# Patient Record
Sex: Female | Born: 1975 | Race: White | Hispanic: No | Marital: Single | Smoking: Never smoker
Health system: Southern US, Community
[De-identification: ages and names within clinical notes are randomized; demographics above are authoritative.]

---

## 2018-09-14 DIAGNOSIS — M79641 Pain in right hand: Secondary | ICD-10-CM | POA: Diagnosis not present

## 2018-09-14 DIAGNOSIS — S63636A Sprain of interphalangeal joint of right little finger, initial encounter: Secondary | ICD-10-CM | POA: Diagnosis not present

## 2019-04-11 ENCOUNTER — Ambulatory Visit: Payer: Self-pay | Attending: Internal Medicine

## 2019-04-11 DIAGNOSIS — Z20822 Contact with and (suspected) exposure to covid-19: Secondary | ICD-10-CM

## 2019-04-12 LAB — NOVEL CORONAVIRUS, NAA: SARS-CoV-2, NAA: NOT DETECTED

## 2019-04-19 DIAGNOSIS — M25561 Pain in right knee: Secondary | ICD-10-CM | POA: Diagnosis not present

## 2019-04-25 DIAGNOSIS — M25561 Pain in right knee: Secondary | ICD-10-CM | POA: Diagnosis not present

## 2019-04-25 DIAGNOSIS — M6281 Muscle weakness (generalized): Secondary | ICD-10-CM | POA: Diagnosis not present

## 2019-04-25 DIAGNOSIS — M222X1 Patellofemoral disorders, right knee: Secondary | ICD-10-CM | POA: Diagnosis not present

## 2019-04-29 DIAGNOSIS — M222X1 Patellofemoral disorders, right knee: Secondary | ICD-10-CM | POA: Diagnosis not present

## 2019-04-29 DIAGNOSIS — M6281 Muscle weakness (generalized): Secondary | ICD-10-CM | POA: Diagnosis not present

## 2019-04-29 DIAGNOSIS — M25561 Pain in right knee: Secondary | ICD-10-CM | POA: Diagnosis not present

## 2019-05-09 DIAGNOSIS — M6281 Muscle weakness (generalized): Secondary | ICD-10-CM | POA: Diagnosis not present

## 2019-05-09 DIAGNOSIS — M222X1 Patellofemoral disorders, right knee: Secondary | ICD-10-CM | POA: Diagnosis not present

## 2019-05-09 DIAGNOSIS — M25561 Pain in right knee: Secondary | ICD-10-CM | POA: Diagnosis not present

## 2019-06-02 ENCOUNTER — Ambulatory Visit: Payer: Self-pay

## 2019-07-06 DIAGNOSIS — M25561 Pain in right knee: Secondary | ICD-10-CM | POA: Diagnosis not present

## 2019-07-06 DIAGNOSIS — M6281 Muscle weakness (generalized): Secondary | ICD-10-CM | POA: Diagnosis not present

## 2019-07-06 DIAGNOSIS — M222X1 Patellofemoral disorders, right knee: Secondary | ICD-10-CM | POA: Diagnosis not present

## 2019-07-14 ENCOUNTER — Ambulatory Visit: Payer: Self-pay | Attending: Internal Medicine

## 2019-07-14 DIAGNOSIS — Z23 Encounter for immunization: Secondary | ICD-10-CM

## 2019-07-14 NOTE — Progress Notes (Signed)
   Covid-19 Vaccination Clinic  Name:  Julie Nelson    MRN: [id number] DOB: [birth year]  07/14/2019  [name] was observed post Covid-19 immunization for 15 minutes without incident. She was provided with Vaccine Information Sheet and instruction to access the V-Safe system.   [name] was instructed to call 911 with any severe reactions post vaccine: Marland Kitchen Difficulty breathing  . Swelling of face and throat  . A fast heartbeat  . A bad rash all over body  . Dizziness and weakness   Immunizations Administered    Name Date Dose VIS Date Route   Pfizer COVID-19 Vaccine 07/14/2019  4:10 PM 0.3 mL 04/13/2018 Intramuscular   Manufacturer: [employer], [employer]   Lot: [id number]   NDC: [address]-1

## 2019-08-06 ENCOUNTER — Ambulatory Visit: Payer: Self-pay | Attending: Internal Medicine

## 2019-08-06 DIAGNOSIS — Z23 Encounter for immunization: Secondary | ICD-10-CM

## 2019-08-06 NOTE — Progress Notes (Signed)
   Covid-19 Vaccination Clinic  Name:  Julie Nelson    MRN: [id number] DOB: 1975-10-09  08/06/2019  [name] was observed post Covid-19 immunization for 15 minutes without incident. She was provided with Vaccine Information Sheet and instruction to access the V-Safe system.   [name] was instructed to call 911 with any severe reactions post vaccine: Marland Kitchen Difficulty breathing  . Swelling of face and throat  . A fast heartbeat  . A bad rash all over body  . Dizziness and weakness   Immunizations Administered    Name Date Dose VIS Date Route   Pfizer COVID-19 Vaccine 08/06/2019 11:53 AM 0.3 mL 04/13/2018 Intramuscular   Manufacturer: [employer], [employer]   Lot: [id number]   NDC: [address]-6

## 2019-08-08 ENCOUNTER — Ambulatory Visit: Payer: Self-pay

## 2020-01-06 ENCOUNTER — Ambulatory Visit
Admission: EM | Admit: 2020-01-06 | Discharge: 2020-01-06 | Disposition: A | Discharge status: Home / Self Care | Payer: BLUE CROSS/BLUE SHIELD | Attending: Family Medicine | Admitting: Family Medicine

## 2020-01-06 DIAGNOSIS — M549 Dorsalgia, unspecified: Secondary | ICD-10-CM

## 2020-01-06 DIAGNOSIS — R109 Unspecified abdominal pain: Secondary | ICD-10-CM

## 2020-01-06 LAB — POCT URINALYSIS DIP (MANUAL ENTRY)
Bilirubin, UA: NEGATIVE
Glucose, UA: NEGATIVE mg/dL
Ketones, POC UA: NEGATIVE mg/dL
Leukocytes, UA: NEGATIVE
Nitrite, UA: NEGATIVE
Protein Ur, POC: NEGATIVE mg/dL
Spec Grav, UA: 1.01 (ref 1.010–1.025)
Urobilinogen, UA: 0.2 E.U./dL
pH, UA: 6 (ref 5.0–8.0)

## 2020-01-06 LAB — POCT URINE PREGNANCY: Preg Test, Ur: NEGATIVE

## 2020-01-06 NOTE — ED Provider Notes (Signed)
[facility]    CSN: [id number] Arrival date & time: 01/06/20  1259      History   Chief Complaint Chief Complaint  Patient presents with  . Chest Pain    HPI Julie Nelson is a 44 y.o. female.   HPI  Patient presents today with pain involving right flank below rib cage and right sided back pain. She also endorses 4 weeks of feeling lightheaded intermittently.  She has not seen a healthcare provider in over 3 years and has a complete physical exam scheduled for 01/30/2020.  She denies any specific chest pain symptoms.  She denies any known history of hyperlipidemia or hypertension.  She reports that these episodes of right flank pain have been occurring over the last 7 days.  They last for approximately 6 seconds and are sharp and feels more muscular in nature.  The symptoms are nonreproducible.  She grew concerned as she had approximately 6 episodes in the last 24 hours and today she experienced some back pain.  Reports over the last week she had had dairy products while visiting at the beach and she normally does not intake of dairy products as this causes her to have back pain.  In the past when she stopped consuming dairy type products the back pain has gone away.  She denies any gas but also endorses some bloating sensations in her abdomen.  No fever and no other URI symptoms.  History reviewed. No pertinent past medical history.  There are no problems to display for this patient.   History reviewed. No pertinent surgical history.  OB History   No obstetric history on file.      Home Medications    Prior to Admission medications   Not on File    Family History History reviewed. No pertinent family history.  Social History Social History   Tobacco Use  . Smoking status: Never Smoker  . Smokeless tobacco: Never Used  Substance Use Topics  . Alcohol use: Yes  . Drug use: Never     Allergies   Patient has no known allergies.  Review of  Systems Review of Systems Pertinent negatives listed in HPI   Physical Exam Triage Vital Signs ED Triage Vitals [01/06/20 1318]  Enc Vitals Group     BP (!) 152/93     Pulse Rate 65     Resp 18     Temp 98 F (36.7 C)     Temp Source Oral     SpO2 98 %     Weight      Height      Head Circumference      Peak Flow      Pain Score 0     Pain Loc      Pain Edu?      Excl. in GC?    No data found.  Updated Vital Signs BP (!) 152/93 (BP Location: Left Arm)   Pulse 65   Temp 98 F (36.7 C) (Oral)   Resp 18   LMP 12/23/2019   SpO2 98%   Visual Acuity Right Eye Distance:   Left Eye Distance:   Bilateral Distance:    Right Eye Near:   Left Eye Near:    Bilateral Near:     Physical Exam Constitutional:      Appearance: She is not ill-appearing.  HENT:     Head: Normocephalic and atraumatic.  Cardiovascular:     Rate and Rhythm: Normal rate and regular  rhythm.  Pulmonary:     Effort: Pulmonary effort is normal.     Breath sounds: Normal breath sounds.  Abdominal:     General: Bowel sounds are normal.     Palpations: Abdomen is soft. There is no splenomegaly.     Tenderness: There is no abdominal tenderness. There is no guarding or rebound.  Musculoskeletal:     Cervical back: Normal range of motion.  Skin:    General: Skin is warm and dry.     Capillary Refill: Capillary refill takes less than 2 seconds.  Neurological:     General: No focal deficit present.     Mental Status: She is alert and oriented to person, place, and time.  Psychiatric:        Mood and Affect: Mood normal.      UC Treatments / Results  Labs (all labs ordered are listed, but only abnormal results are displayed) Labs Reviewed  POCT URINALYSIS DIP (MANUAL ENTRY)  POCT URINE PREGNANCY    EKG   Radiology No results found.  Procedures Procedures (including critical care time)  Medications Ordered in UC Medications - No data to display  Initial Impression / Assessment  and Plan / UC Course  I have reviewed the triage vital signs and the nursing notes.  Pertinent labs & imaging results that were available during my care of the patient were reviewed by me and considered in my medical decision making (see chart for details).    UA negative.  Exam is negative for any peritoneal signs or any other concerning findings that would pertain to an acute abdomen.  Pain is not reproducible on exam.  EKG normal sinus rhythm, rate 64 bpm no ST changes.  Advised patient basic exam here findings are nonworrisome however patient should continue with physical exam scheduled for December with primary care provider.  Strict ER precautions given.  Also advised to trial Tylenol in order Tums to determine if symptoms she is experiencing is related to a GI etiology and or a musculoskeletal nature.  Patient patient verbalized understanding and agreement with plan will go to the ER if symptoms worsen or continue to occur. Final Clinical Impressions(s) / UC Diagnoses   Final diagnoses:  Right flank pain  Acute right-sided back pain, unspecified back location     Discharge Instructions     Your urinalysis was clear today.  If pain recurs recommend trying Tylenol 500 mg if this resolves the symptoms then pain is likely related to musculoskeletal.  If Tylenol is ineffective recommend Tums which would relieve any acid or indigestion type symptoms.  If any of your symptoms become severe or worsen I would recommend immediate evaluation in the setting of the emergency department.  Otherwise can keep follow-up for complete physical exam with your primary care in a few weeks.    ED Prescriptions    None     PDMP not reviewed this encounter.   Bing Neighbors, FNP 01/06/20 1419

## 2020-01-06 NOTE — Discharge Instructions (Addendum)
Your urinalysis was clear today.  If pain recurs recommend trying Tylenol 500 mg if this resolves the symptoms then pain is likely related to musculoskeletal.  If Tylenol is ineffective recommend Tums which would relieve any acid or indigestion type symptoms.  If any of your symptoms become severe or worsen I would recommend immediate evaluation in the setting of the emergency department.  Otherwise can keep follow-up for complete physical exam with your primary care in a few weeks.

## 2020-01-06 NOTE — ED Triage Notes (Signed)
Pt c/o sharp pain under lt breast x4 day. States had 6 episodes today lasting 2 secs each. States had a pain shoot through to back x1. Pt c/o having dizziness and SOB at different times then the sharp pain. No distress noted.

## 2020-01-30 DIAGNOSIS — Z23 Encounter for immunization: Secondary | ICD-10-CM | POA: Diagnosis not present

## 2020-01-30 DIAGNOSIS — Z0000 Encounter for general adult medical examination without abnormal findings: Secondary | ICD-10-CM | POA: Diagnosis not present

## 2020-01-30 DIAGNOSIS — R739 Hyperglycemia, unspecified: Secondary | ICD-10-CM | POA: Diagnosis not present

## 2020-02-02 ENCOUNTER — Other Ambulatory Visit: Payer: Self-pay | Admitting: Internal Medicine

## 2020-02-02 DIAGNOSIS — Z1231 Encounter for screening mammogram for malignant neoplasm of breast: Secondary | ICD-10-CM

## 2020-03-08 ENCOUNTER — Other Ambulatory Visit: Payer: Self-pay | Admitting: Internal Medicine

## 2020-03-08 DIAGNOSIS — N644 Mastodynia: Secondary | ICD-10-CM

## 2020-03-12 DIAGNOSIS — Z124 Encounter for screening for malignant neoplasm of cervix: Secondary | ICD-10-CM | POA: Diagnosis not present

## 2020-03-12 DIAGNOSIS — E785 Hyperlipidemia, unspecified: Secondary | ICD-10-CM | POA: Diagnosis not present

## 2020-03-23 DIAGNOSIS — Z20822 Contact with and (suspected) exposure to covid-19: Secondary | ICD-10-CM | POA: Diagnosis not present

## 2020-04-19 ENCOUNTER — Other Ambulatory Visit: Payer: Self-pay

## 2020-04-19 ENCOUNTER — Ambulatory Visit: Payer: BLUE CROSS/BLUE SHIELD

## 2020-04-19 ENCOUNTER — Ambulatory Visit
Admission: RE | Admit: 2020-04-19 | Discharge: 2020-04-19 | Disposition: A | Discharge status: Home / Self Care | Payer: BLUE CROSS/BLUE SHIELD | Source: Ambulatory Visit | Attending: Internal Medicine | Admitting: Internal Medicine

## 2020-04-19 DIAGNOSIS — N644 Mastodynia: Secondary | ICD-10-CM

## 2021-01-15 DIAGNOSIS — H5213 Myopia, bilateral: Secondary | ICD-10-CM | POA: Diagnosis not present

## 2022-02-11 IMAGING — MG DIGITAL DIAGNOSTIC BILAT W/ TOMO W/ CAD
8 series · 9 of 24 positions shown · non-contrast
Comparison: None.

CLINICAL DATA: Diffuse bilateral breast pain/tenderness.

EXAM:
DIGITAL DIAGNOSTIC BILATERAL MAMMOGRAM WITH TOMOSYNTHESIS AND CAD
TECHNIQUE: Bilateral digital diagnostic mammography and breast tomosynthesis
was performed. The images were evaluated with computer-aided
detection.

[L MLO synth-2D]
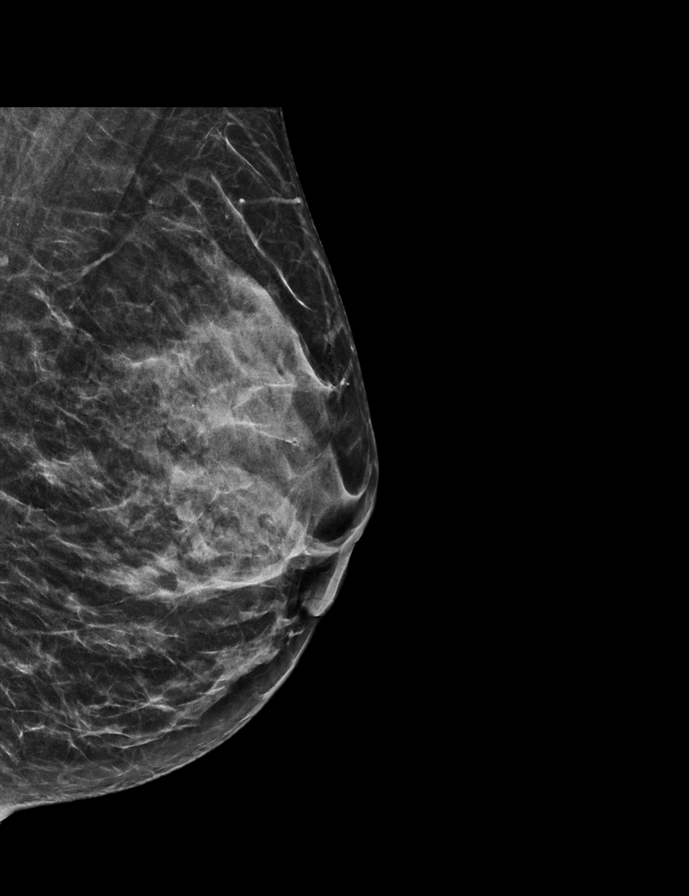

[R MLO synth-2D]
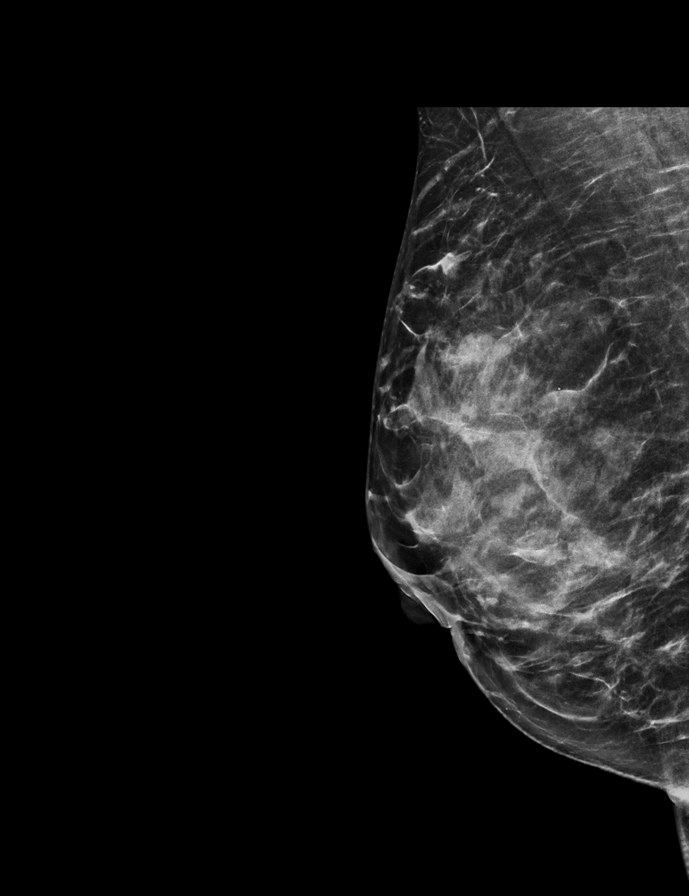

[L CC synth-2D]
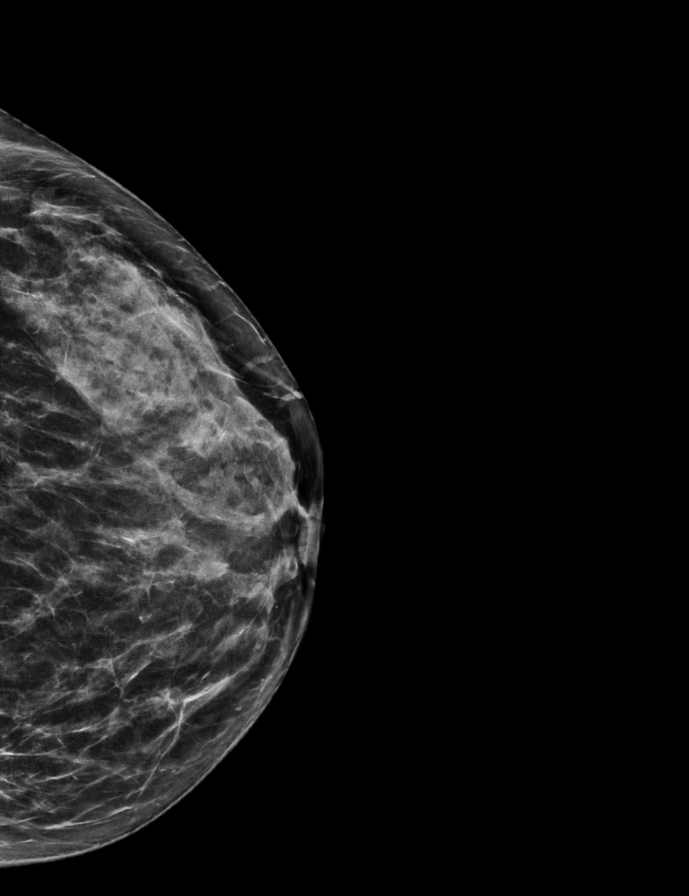

[R CC synth-2D]
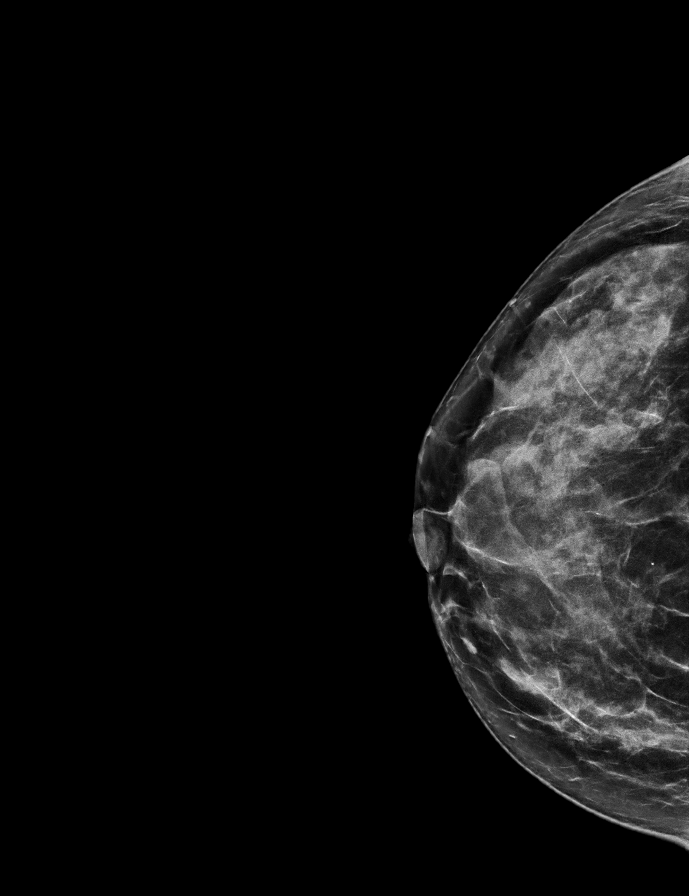

[R CC tomo · 2 of 59 frames shown]
[frame 20/59]
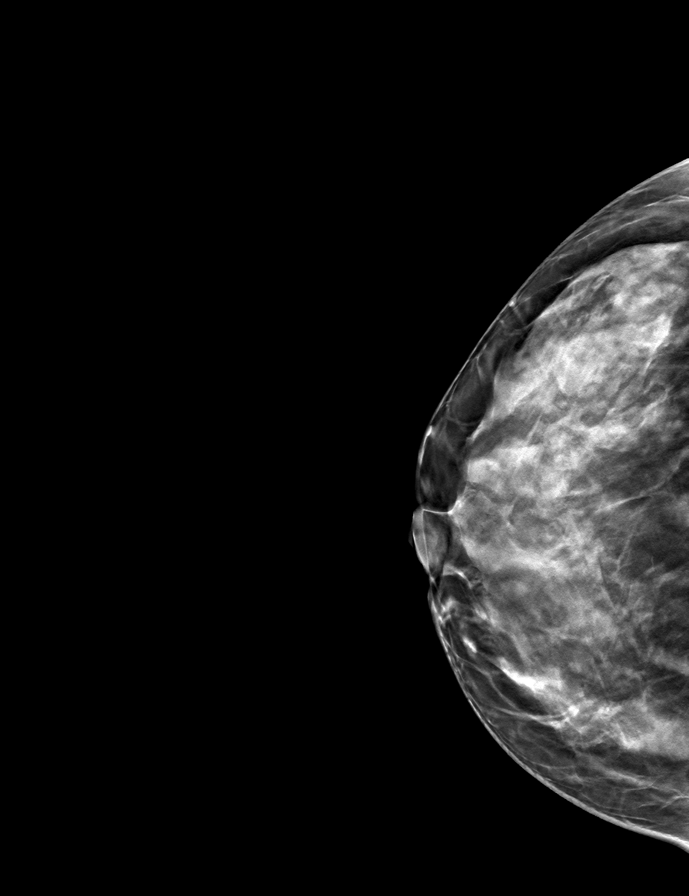
[frame 30/59]
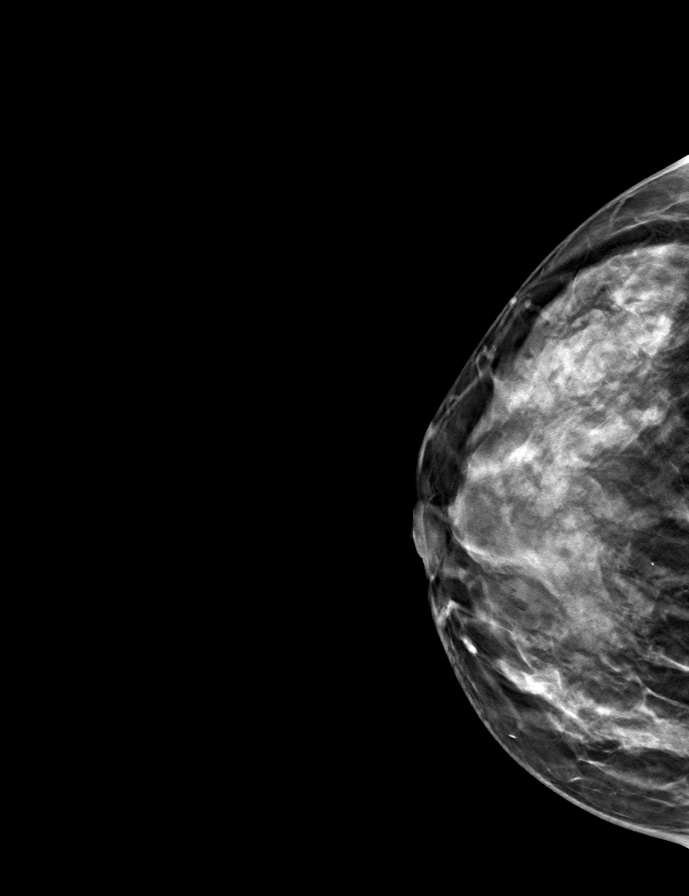

[R MLO tomo · tomo slice 30/59.0]
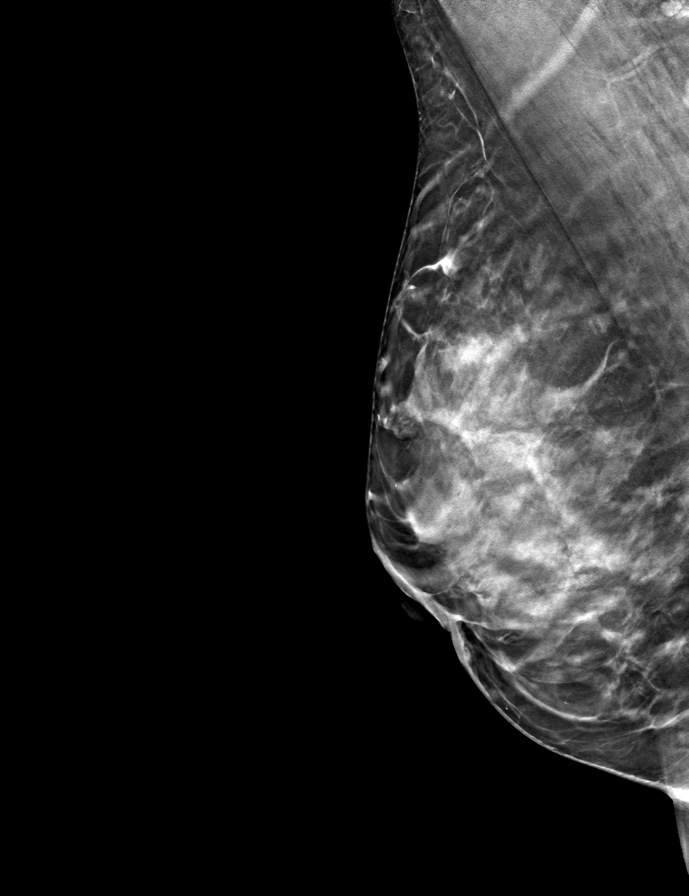

[L CC tomo · tomo slice 29/56.0]
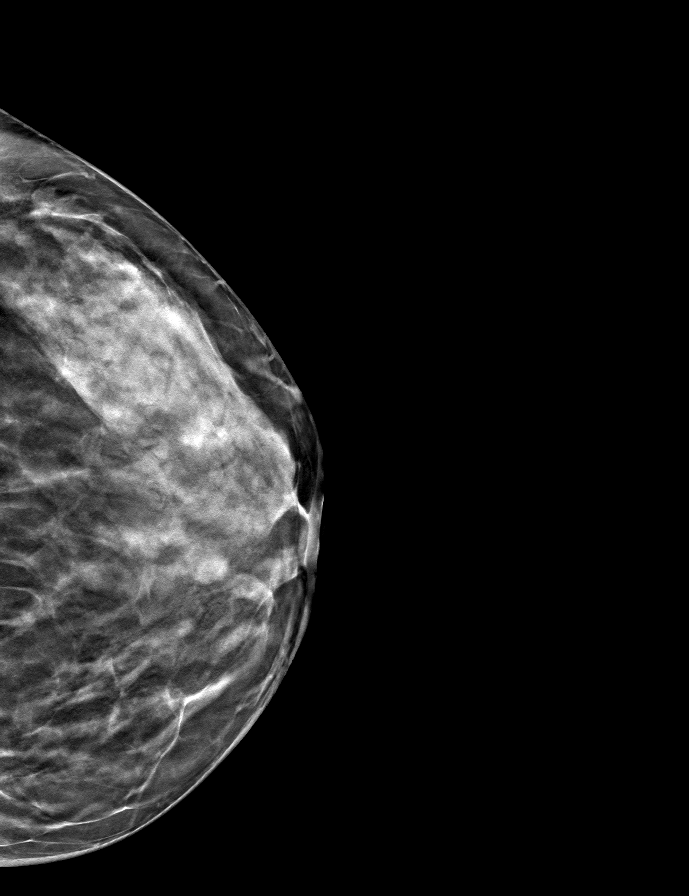

[L MLO tomo · tomo slice 29/56.0]
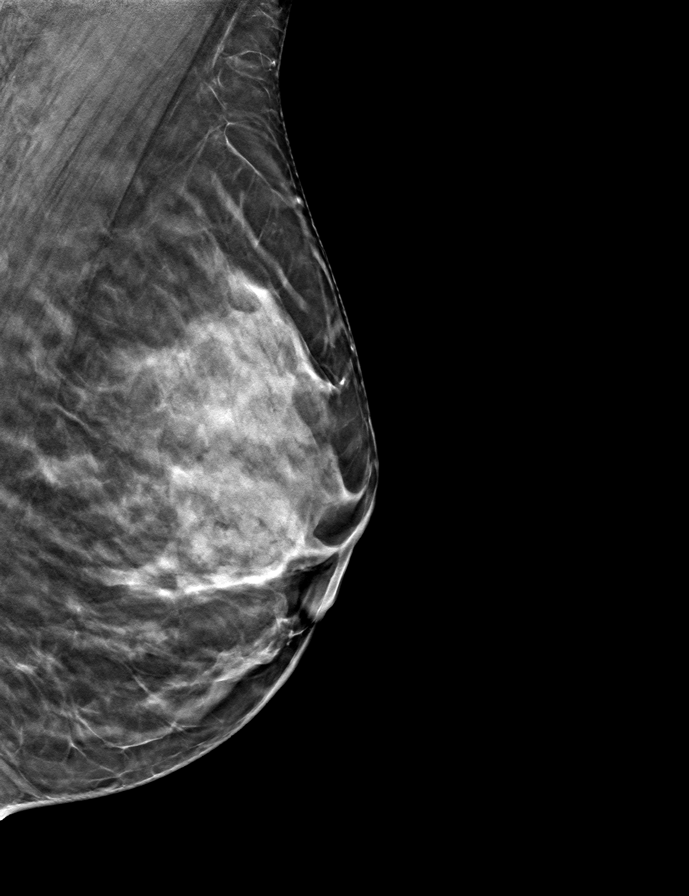

[9 of 24 positions shown; findings below may reference images not displayed]

ACR Breast Density Category c: The breast tissue is heterogeneously
dense, which may obscure small masses.
FINDINGS: No suspicious mass, distortion, or microcalcifications are
identified to suggest presence of malignancy.
IMPRESSION: No mammographic evidence for malignancy.

RECOMMENDATION:
Screening mammogram in one year.(Code:[id number])

I have discussed the findings and recommendations with the patient.
If applicable, a reminder letter will be sent to the patient
regarding the next appointment.

BI-RADS CATEGORY  1: Negative.

## 2022-03-14 DIAGNOSIS — Z13220 Encounter for screening for lipoid disorders: Secondary | ICD-10-CM | POA: Diagnosis not present

## 2022-03-14 DIAGNOSIS — Z0000 Encounter for general adult medical examination without abnormal findings: Secondary | ICD-10-CM | POA: Diagnosis not present

## 2023-03-11 DIAGNOSIS — Z0000 Encounter for general adult medical examination without abnormal findings: Secondary | ICD-10-CM | POA: Diagnosis not present

## 2023-03-11 DIAGNOSIS — Z1329 Encounter for screening for other suspected endocrine disorder: Secondary | ICD-10-CM | POA: Diagnosis not present

## 2023-03-11 DIAGNOSIS — Z13220 Encounter for screening for lipoid disorders: Secondary | ICD-10-CM | POA: Diagnosis not present

## 2023-04-29 DIAGNOSIS — L814 Other melanin hyperpigmentation: Secondary | ICD-10-CM | POA: Diagnosis not present

## 2023-04-29 DIAGNOSIS — D225 Melanocytic nevi of trunk: Secondary | ICD-10-CM | POA: Diagnosis not present

## 2023-04-29 DIAGNOSIS — L821 Other seborrheic keratosis: Secondary | ICD-10-CM | POA: Diagnosis not present
